# Patient Record
Sex: Male | Born: 2005 | Hispanic: Yes | Marital: Single | Smoking: Never smoker
Health system: Southern US, Community
[De-identification: ages and names within clinical notes are randomized; demographics above are authoritative.]

## PROBLEM LIST (undated history)

## (undated) DIAGNOSIS — R6252 Short stature (child): Secondary | ICD-10-CM

## (undated) HISTORY — DX: Short stature (child): R62.52

---
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
## 2023-02-02 ENCOUNTER — Ambulatory Visit: Payer: Medicaid Other | Admitting: Pediatrics

## 2023-09-07 ENCOUNTER — Ambulatory Visit: Admitting: Pediatrics

## 2023-09-19 ENCOUNTER — Encounter: Payer: Self-pay | Admitting: Pediatrics

## 2023-09-19 ENCOUNTER — Other Ambulatory Visit (HOSPITAL_COMMUNITY)
Admission: RE | Admit: 2023-09-19 | Discharge: 2023-09-19 | Disposition: A | Discharge status: Home / Self Care | Source: Ambulatory Visit | Attending: Pediatrics | Admitting: Pediatrics

## 2023-09-19 ENCOUNTER — Ambulatory Visit: Admitting: Pediatrics

## 2023-09-19 VITALS — BP 110/66 | HR 66 | Ht 61.02 in | Wt 104.0 lb

## 2023-09-19 DIAGNOSIS — Z13220 Encounter for screening for lipoid disorders: Secondary | ICD-10-CM | POA: Diagnosis not present

## 2023-09-19 DIAGNOSIS — Z1331 Encounter for screening for depression: Secondary | ICD-10-CM

## 2023-09-19 DIAGNOSIS — Z0000 Encounter for general adult medical examination without abnormal findings: Secondary | ICD-10-CM

## 2023-09-19 DIAGNOSIS — Z68.41 Body mass index (BMI) pediatric, 5th percentile to less than 85th percentile for age: Secondary | ICD-10-CM

## 2023-09-19 DIAGNOSIS — Z131 Encounter for screening for diabetes mellitus: Secondary | ICD-10-CM | POA: Diagnosis not present

## 2023-09-19 DIAGNOSIS — Z113 Encounter for screening for infections with a predominantly sexual mode of transmission: Secondary | ICD-10-CM

## 2023-09-19 DIAGNOSIS — Z1339 Encounter for screening examination for other mental health and behavioral disorders: Secondary | ICD-10-CM

## 2023-09-19 DIAGNOSIS — Z114 Encounter for screening for human immunodeficiency virus [HIV]: Secondary | ICD-10-CM | POA: Diagnosis not present

## 2023-09-19 LAB — COMPREHENSIVE METABOLIC PANEL WITH GFR
AG Ratio: 1.9 (calc) (ref 1.0–2.5)
ALT: 16 U/L (ref 9–46)
AST: 21 U/L (ref 10–40)
Albumin: 4.8 g/dL (ref 3.6–5.1)
Alkaline phosphatase (APISO): 69 U/L (ref 36–130)
BUN: 12 mg/dL (ref 7–25)
CO2: 27 mmol/L (ref 20–32)
Calcium: 9.4 mg/dL (ref 8.6–10.3)
Chloride: 103 mmol/L (ref 98–110)
Creat: 0.84 mg/dL (ref 0.60–1.24)
Globulin: 2.5 g/dL (ref 1.9–3.7)
Glucose, Bld: 87 mg/dL (ref 65–99)
Potassium: 3.6 mmol/L (ref 3.5–5.3)
Sodium: 137 mmol/L (ref 135–146)
Total Bilirubin: 1.1 mg/dL (ref 0.2–1.2)
Total Protein: 7.3 g/dL (ref 6.1–8.1)
eGFR: 128 mL/min/1.73m2 (ref >=60)

## 2023-09-19 LAB — LIPID PANEL
Cholesterol: 144 mg/dL (ref <200)
HDL: 72 mg/dL (ref >=40)
LDL Cholesterol (Calc): 57 mg/dL
Non-HDL Cholesterol (Calc): 72 mg/dL (ref <130)
Total CHOL/HDL Ratio: 2 (calc) (ref <5.0)
Triglycerides: 72 mg/dL (ref <150)

## 2023-09-19 LAB — HEMOGLOBIN A1C
Hgb A1c MFr Bld: 5.2 % (ref <5.7)
Mean Plasma Glucose: 103 mg/dL
eAG (mmol/L): 5.7 mmol/L

## 2023-09-19 LAB — POCT RAPID HIV: Rapid HIV, POC: NEGATIVE

## 2023-09-19 NOTE — Progress Notes (Signed)
 Adolescent Well Care Visit George Huff is a 18 y.o. male who is here for well care.     PCP:  George Clapper, MD   History was provided by the patient.  Confidentiality was discussed with the patient and, if applicable, with caregiver as well. Patient's personal or confidential phone number:    Current Issues: Current concerns include   None - doing well.   Nutrition: Nutrition/Eating Behaviors: eats well - no concerns Adequate calcium in diet?: yes Supplements/ Vitamins: none  Exercise/ Media: Play any Sports?:  none Exercise:  not active Screen Time:  > 2 hours-counseling provided Media Rules or Monitoring?: yes  Sleep:  Sleep: probably inadequate - works overnight and class in day  Social Screening: Lives with:  parents Parental relations:  good Activities, Work, and Regulatory affairs officer?: school and also working over the summer Concerns regarding behavior with peers?  no Stressors of note: no  Education: School Name: [employer] Grade: rising Probation officer: doing well; no concerns School Behavior: doing well; no concerns  Patient has a dental home: yes   Confidential social history: Tobacco?  no Secondhand smoke exposure?  no Drugs/ETOH?  no  Sexually Active?  no   Pregnancy Prevention: none  Safe at home, in school & in relationships?  Yes Safe to self?  Yes   Screenings:  The patient completed the Rapid Assessment for Adolescent Preventive Services screening questionnaire and the following topics were identified as risk factors and discussed: healthy eating and exercise  In addition, the following topics were discussed as part of anticipatory guidance healthy eating and exercise.  PHQ-9 completed and results indicated no concerns  Physical Exam:  Vitals:   09/19/23 0912  BP: 110/66  Pulse: 66  SpO2: 97%  Weight: 104 lb (47.2 kg)  Height: 5' 1.02 (1.55 m)   BP 110/66 (BP Location: Left Arm, Patient Position: Sitting, Cuff Size:  Normal)   Pulse 66   Ht 5' 1.02 (1.55 m)   Wt 104 lb (47.2 kg)   SpO2 97%   BMI 19.64 kg/m  Body mass index: body mass index is 19.64 kg/m. Growth %ile SmartLinks can only be used for patients less than 35 years old.  Hearing Screening  Method: Audiometry   500Hz  1000Hz  2000Hz  4000Hz   Right ear 20 20 20 20   Left ear 20 20 20 20    Vision Screening   Right eye Left eye Both eyes  Without correction 20/20 20/20 20/20   With correction       Physical Exam Vitals and nursing note reviewed.  Constitutional:      General: He is not in acute distress.    Appearance: He is well-developed.  HENT:     Head: Normocephalic.     Right Ear: External ear normal.     Left Ear: External ear normal.     Nose: Nose normal.     Mouth/Throat:     Pharynx: No oropharyngeal exudate.  Eyes:     Conjunctiva/sclera: Conjunctivae normal.     Pupils: Pupils are equal, round, and reactive to light.  Neck:     Thyroid: No thyromegaly.  Cardiovascular:     Rate and Rhythm: Normal rate.     Heart sounds: Normal heart sounds. No murmur heard. Pulmonary:     Effort: Pulmonary effort is normal.     Breath sounds: Normal breath sounds.  Abdominal:     General: Bowel sounds are normal.     Palpations: Abdomen is soft. There  is no mass.     Tenderness: There is no abdominal tenderness.     Hernia: There is no hernia in the left inguinal area.  Genitourinary:    Penis: Normal.      Testes: Normal.        Right: Mass not present. Right testis is descended.        Left: Mass not present. Left testis is descended.  Musculoskeletal:        General: Normal range of motion.     Cervical back: Normal range of motion and neck supple.  Lymphadenopathy:     Cervical: No cervical adenopathy.  Skin:    General: Skin is warm and dry.     Findings: No rash.  Neurological:     Mental Status: He is alert and oriented to person, place, and time.     Cranial Nerves: No cranial nerve deficit.       Assessment and Plan:   1. Encounter for general adult medical examination without abnormal findings (Primary)  2. Screening for human immunodeficiency virus - POCT Rapid HIV  3. Screening examination for venereal disease - Urine cytology ancillary only  4. Body mass index, pediatric, 5th percentile to less than 85th percentile for age - Comprehensive metabolic panel with GFR  5. Screening for cholesterol level - Lipid panel  6. Screening for diabetes mellitus - Hemoglobin A1c   BMI is appropriate for age  Hearing screening result:normal Vision screening result: normal  Counseling provided for all of the vaccine components  Orders Placed This Encounter  Procedures   POCT Rapid HIV  Vaccines up to date  PE in one year  Discussed transition to adult medicine   No follow-ups on file.[name], MD

## 2023-09-20 LAB — URINE CYTOLOGY ANCILLARY ONLY
Chlamydia: NEGATIVE
Comment: NEGATIVE
Comment: NORMAL
Neisseria Gonorrhea: NEGATIVE

## 2023-10-06 ENCOUNTER — Ambulatory Visit: Payer: Self-pay | Admitting: Pediatrics
